# Patient Record
Sex: Male | Born: 1955 | Race: Black or African American | Hispanic: No | Marital: Married | Smoking: Former smoker
Health system: Southern US, Community
[De-identification: ages and names within clinical notes are randomized; demographics above are authoritative.]

## PROBLEM LIST (undated history)

## (undated) DIAGNOSIS — N289 Disorder of kidney and ureter, unspecified: Secondary | ICD-10-CM

## (undated) DIAGNOSIS — E785 Hyperlipidemia, unspecified: Secondary | ICD-10-CM

## (undated) DIAGNOSIS — I10 Essential (primary) hypertension: Secondary | ICD-10-CM

## (undated) DIAGNOSIS — C61 Malignant neoplasm of prostate: Secondary | ICD-10-CM

## (undated) DIAGNOSIS — M109 Gout, unspecified: Secondary | ICD-10-CM

## (undated) DIAGNOSIS — E119 Type 2 diabetes mellitus without complications: Secondary | ICD-10-CM

## (undated) DIAGNOSIS — M1990 Unspecified osteoarthritis, unspecified site: Secondary | ICD-10-CM

## (undated) DIAGNOSIS — K648 Other hemorrhoids: Secondary | ICD-10-CM

## (undated) HISTORY — DX: Disorder of kidney and ureter, unspecified: N28.9

## (undated) HISTORY — DX: Gout, unspecified: M10.9

## (undated) HISTORY — DX: Unspecified osteoarthritis, unspecified site: M19.90

## (undated) HISTORY — DX: Essential (primary) hypertension: I10

## (undated) HISTORY — DX: Other hemorrhoids: K64.8

## (undated) HISTORY — DX: Type 2 diabetes mellitus without complications: E11.9

## (undated) HISTORY — DX: Malignant neoplasm of prostate: C61

## (undated) HISTORY — PX: NEPHRECTOMY: SHX65

## (undated) HISTORY — DX: Hyperlipidemia, unspecified: E78.5

---

## 1998-10-08 ENCOUNTER — Ambulatory Visit (HOSPITAL_COMMUNITY): Admission: RE | Admit: 1998-10-08 | Discharge: 1998-10-08 | Payer: Self-pay | Admitting: Family Medicine

## 1998-12-02 ENCOUNTER — Ambulatory Visit (HOSPITAL_COMMUNITY): Admission: RE | Admit: 1998-12-02 | Discharge: 1998-12-02 | Payer: Self-pay | Admitting: Urology

## 1998-12-02 ENCOUNTER — Encounter: Payer: Self-pay | Admitting: Urology

## 1999-12-04 ENCOUNTER — Ambulatory Visit (HOSPITAL_COMMUNITY): Admission: RE | Admit: 1999-12-04 | Discharge: 1999-12-04 | Payer: Self-pay | Admitting: Urology

## 1999-12-04 ENCOUNTER — Encounter: Payer: Self-pay | Admitting: Urology

## 2001-06-21 ENCOUNTER — Ambulatory Visit (HOSPITAL_COMMUNITY): Admission: RE | Admit: 2001-06-21 | Discharge: 2001-06-21 | Payer: Self-pay | Admitting: Family Medicine

## 2001-06-21 ENCOUNTER — Encounter: Payer: Self-pay | Admitting: Family Medicine

## 2001-06-27 ENCOUNTER — Encounter: Payer: Self-pay | Admitting: Family Medicine

## 2001-06-27 ENCOUNTER — Encounter: Admission: RE | Admit: 2001-06-27 | Discharge: 2001-06-27 | Payer: Self-pay | Admitting: Family Medicine

## 2001-06-29 ENCOUNTER — Ambulatory Visit (HOSPITAL_COMMUNITY): Admission: RE | Admit: 2001-06-29 | Discharge: 2001-06-29 | Payer: Self-pay | Admitting: Family Medicine

## 2001-06-30 ENCOUNTER — Ambulatory Visit (HOSPITAL_COMMUNITY): Admission: RE | Admit: 2001-06-30 | Discharge: 2001-06-30 | Payer: Self-pay | Admitting: Family Medicine

## 2001-06-30 ENCOUNTER — Encounter: Payer: Self-pay | Admitting: Family Medicine

## 2001-07-06 ENCOUNTER — Encounter: Payer: Self-pay | Admitting: Family Medicine

## 2001-07-06 ENCOUNTER — Ambulatory Visit (HOSPITAL_COMMUNITY): Admission: RE | Admit: 2001-07-06 | Discharge: 2001-07-06 | Payer: Self-pay | Admitting: Family Medicine

## 2001-07-13 ENCOUNTER — Encounter: Admission: RE | Admit: 2001-07-13 | Discharge: 2001-07-13 | Payer: Self-pay | Admitting: Family Medicine

## 2001-07-13 ENCOUNTER — Encounter: Payer: Self-pay | Admitting: Family Medicine

## 2001-08-16 ENCOUNTER — Ambulatory Visit (HOSPITAL_COMMUNITY): Admission: RE | Admit: 2001-08-16 | Discharge: 2001-08-16 | Payer: Self-pay | Admitting: Urology

## 2001-08-16 ENCOUNTER — Encounter: Payer: Self-pay | Admitting: Urology

## 2002-02-12 ENCOUNTER — Emergency Department (HOSPITAL_COMMUNITY): Admission: EM | Admit: 2002-02-12 | Discharge: 2002-02-12 | Payer: Self-pay

## 2002-02-13 ENCOUNTER — Encounter: Payer: Self-pay | Admitting: Family Medicine

## 2002-02-13 ENCOUNTER — Ambulatory Visit (HOSPITAL_COMMUNITY): Admission: RE | Admit: 2002-02-13 | Discharge: 2002-02-13 | Payer: Self-pay | Admitting: Family Medicine

## 2002-03-09 ENCOUNTER — Ambulatory Visit (HOSPITAL_COMMUNITY): Admission: RE | Admit: 2002-03-09 | Discharge: 2002-03-09 | Payer: Self-pay | Admitting: Family Medicine

## 2002-11-24 ENCOUNTER — Ambulatory Visit (HOSPITAL_COMMUNITY): Admission: RE | Admit: 2002-11-24 | Discharge: 2002-11-24 | Payer: Self-pay | Admitting: Family Medicine

## 2003-05-21 ENCOUNTER — Encounter: Payer: Self-pay | Admitting: Family Medicine

## 2003-05-21 ENCOUNTER — Encounter: Admission: RE | Admit: 2003-05-21 | Discharge: 2003-05-21 | Payer: Self-pay | Admitting: Family Medicine

## 2003-11-09 ENCOUNTER — Encounter: Admission: RE | Admit: 2003-11-09 | Discharge: 2003-11-09 | Payer: Self-pay | Admitting: Family Medicine

## 2005-09-08 ENCOUNTER — Ambulatory Visit (HOSPITAL_COMMUNITY): Admission: RE | Admit: 2005-09-08 | Discharge: 2005-09-08 | Payer: Self-pay | Admitting: *Deleted

## 2006-06-24 ENCOUNTER — Ambulatory Visit (HOSPITAL_COMMUNITY): Admission: RE | Admit: 2006-06-24 | Discharge: 2006-06-25 | Payer: Self-pay | Admitting: Urology

## 2007-03-24 IMAGING — CR DG CHEST 2V
2 series · 2 of 2 positions shown · non-contrast
Comparison: Reports of a chest x-ray and chest CT from January 2002.

CLINICAL DATA: Prostate cancer.  Preop. 
CHEST - 2 VIEW:

[view not recorded (1 of 2)]
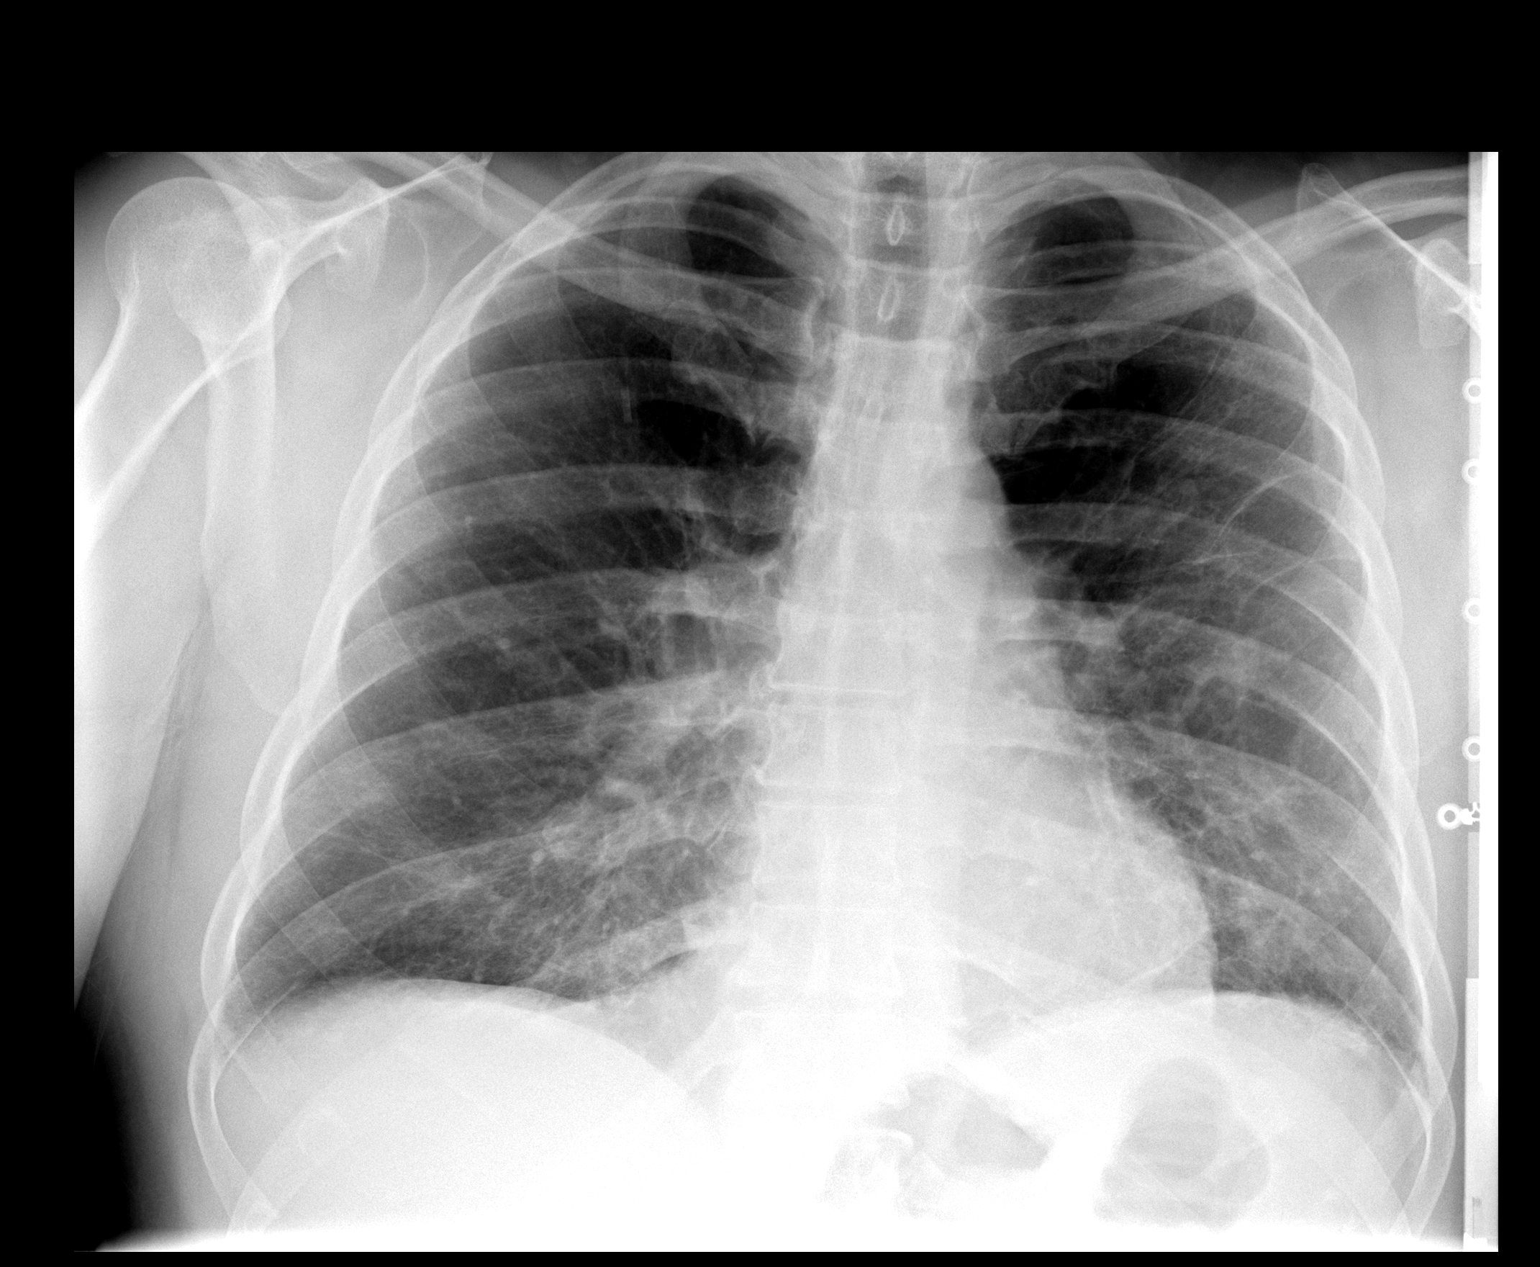

[view not recorded (2 of 2)]
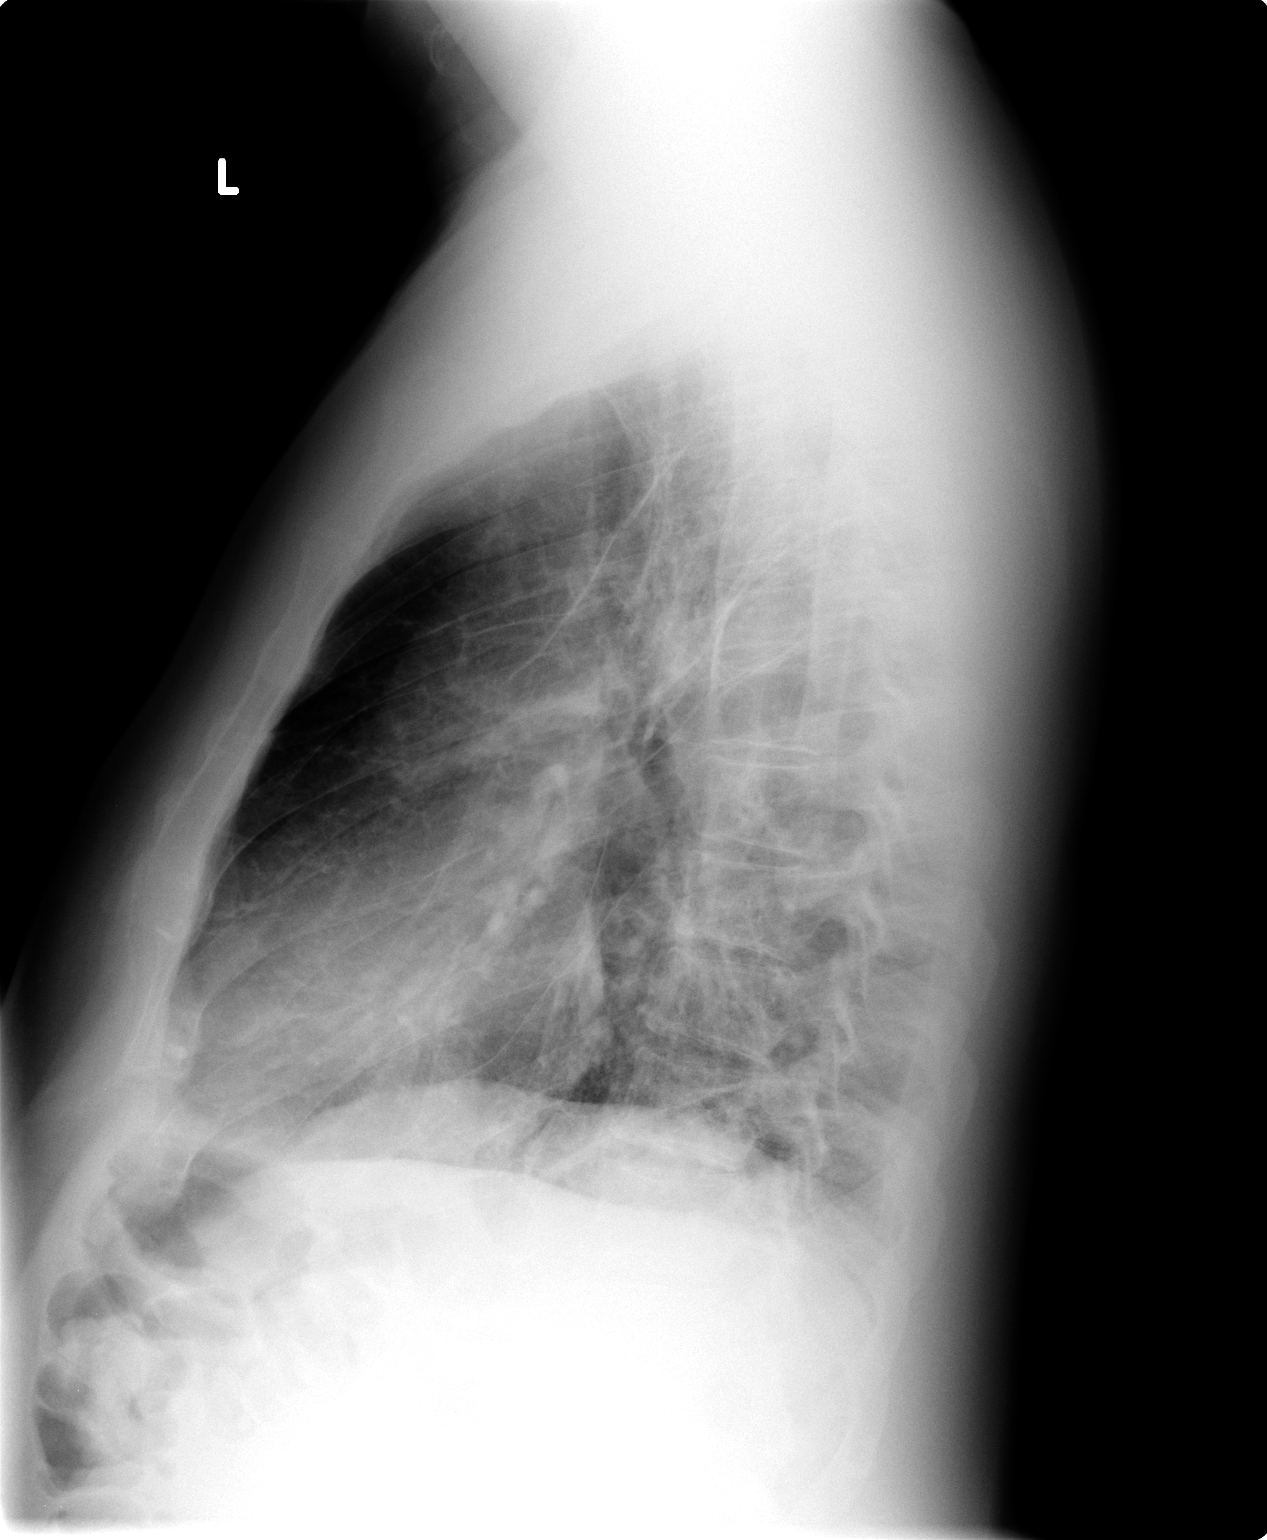

[2 of 2 positions shown; findings below may reference images not displayed]

FINDINGS: Heart is not enlarged.  There is pulmonary scarring especially on the left which was described on prior reports.  There is also scarring on the right.  There does not appear to be any acute infiltrate or effusion and there is no heart failure.
IMPRESSION: Chronic lung disease and bilateral scarring.  No acute abnormality.

## 2016-03-17 ENCOUNTER — Encounter: Payer: Self-pay | Admitting: Gastroenterology

## 2016-05-25 ENCOUNTER — Encounter (INDEPENDENT_AMBULATORY_CARE_PROVIDER_SITE_OTHER): Payer: Self-pay

## 2016-05-25 ENCOUNTER — Ambulatory Visit (INDEPENDENT_AMBULATORY_CARE_PROVIDER_SITE_OTHER): Payer: BLUE CROSS/BLUE SHIELD | Admitting: Gastroenterology

## 2016-05-25 ENCOUNTER — Encounter: Payer: Self-pay | Admitting: Gastroenterology

## 2016-05-25 VITALS — BP 130/72 | HR 76 | Ht 71.0 in | Wt 224.8 lb

## 2016-05-25 DIAGNOSIS — K625 Hemorrhage of anus and rectum: Secondary | ICD-10-CM

## 2016-05-25 DIAGNOSIS — Z1211 Encounter for screening for malignant neoplasm of colon: Secondary | ICD-10-CM | POA: Diagnosis not present

## 2016-05-25 MED ORDER — NA SULFATE-K SULFATE-MG SULF 17.5-3.13-1.6 GM/177ML PO SOLN: 1.0000 | Freq: Once | ORAL | 0 refills | Status: AC

## 2016-05-25 NOTE — Progress Notes (Signed)
HPI: This is a very pleasant 59 year old man   who was referred to me by Dr. Criss Rosales  Chief complaint is rectal bleeding 6 months ago  Saw rectal bleeding this past April, lasted 3-4 days.  No obvious hemorrhoids.  No straining, no diarrhea.  He has not noticed any bleeding since then  Had been taking Bayer ASA for about a week prior, stopped it.  Prostate cancer, 6 years ago, s/p surgery no XRT.  Colonoscopy in 2007; no issues, was for Routine screening. This was done by Dr. Jim Desanctis. I cannot find the actual report in the Epic electronic medical system.  No colon cancer in the family.  Intentionally losing weight, 16 pounds in about a year.  Review of systems: Pertinent positive and negative review of systems were noted in the above HPI section. Complete review of systems was performed and was otherwise normal.   Past Medical History:  Diagnosis Date  . Arthritis   . Diabetes ([place])   . Gout   . HTN (hypertension)   . Hyperlipidemia   . Internal hemorrhoids   . Prostate cancer ([place])   . Renal insufficiency     Past Surgical History:  Procedure Laterality Date  . NEPHRECTOMY      Current Outpatient Prescriptions  Medication Sig Dispense Refill  . Amlodipine-Valsartan-HCTZ 10-320-25 MG TABS Take 1 tablet by mouth daily.    Marland Kitchen BYSTOLIC 20 MG TABS Take 1 tablet by mouth daily.    Marland Kitchen doxazosin (CARDURA) 4 MG tablet Take 4 mg by mouth daily.    Marland Kitchen L-Methylfolate-Algae-B12-B6 (METANX) 3-90.314-2-35 MG CAPS Take 1 Can by mouth 2 (two) times daily.    Marland Kitchen ULORIC 40 MG tablet Take 1 tablet by mouth daily.     No current facility-administered medications for this visit.     Allergies as of 05/25/2016  . (No Known Allergies)    Family History  Problem Relation Age of Onset  . Colon cancer Father 35  . Diabetes Mother   . Kidney disease Mother     Social History   Social History  . Marital status: Married    Spouse name: N/A  . Number of children: 3  . Years of  education: N/A   Occupational History  . Financial    Social History Main Topics  . Smoking status: Former Research scientist (life sciences)  . Smokeless tobacco: Never Used  . Alcohol use No  . Drug use: No  . Sexual activity: Not on file   Other Topics Concern  . Not on file   Social History Narrative  . No narrative on file     Physical Exam: BP 130/72 (BP Location: Left Arm, Patient Position: Sitting, Cuff Size: Normal)   Pulse 76   Ht 5\' 11"  (1.803 m)   Wt 224 lb 12.8 oz (102 kg)   BMI 31.35 kg/m  Constitutional: generally well-appearing Psychiatric: alert and oriented x3 Eyes: extraocular movements intact Mouth: oral pharynx moist, no lesions Neck: supple no lymphadenopathy Cardiovascular: heart regular rate and rhythm Lungs: clear to auscultation bilaterally Abdomen: soft, nontender, nondistended, no obvious ascites, no peritoneal signs, normal bowel sounds Extremities: no lower extremity edema bilaterally Skin: no lesions on visible extremities Rectal exam deferred for upcoming colonoscopy  Assessment and plan: 60 y.o. male with  routine risk for colon cancer minor rectal bleeding 6 months ago.,   I think it is very unlikely that he has any serious, significant pathology. His minor rectal bleeding was probably hemorrhoidal related, benign anal rectal  disease. He has not noticed any bleeding again in 6 months. I recommended we proceed with colonoscopy for colon cancer screening at his soonest convenience. His last colonoscopy was little over 10 years ago. I see no reason for any further blood tests or imaging studies prior to then.    Owens Loffler, MD [facility] 05/25/2016, 1:33 PM  Cc: Dr. Criss Rosales

## 2016-05-25 NOTE — Patient Instructions (Signed)
You will be set up for a colonoscopy for routine screening.  

## 2016-06-10 ENCOUNTER — Encounter: Payer: Self-pay | Admitting: Gastroenterology

## 2016-06-22 ENCOUNTER — Telehealth: Payer: Self-pay | Admitting: Gastroenterology

## 2016-06-22 ENCOUNTER — Other Ambulatory Visit: Payer: Self-pay

## 2016-06-22 MED ORDER — NA SULFATE-K SULFATE-MG SULF 17.5-3.13-1.6 GM/177ML PO SOLN: 1.0000 | Freq: Once | ORAL | 0 refills | Status: AC

## 2016-06-23 ENCOUNTER — Encounter: Payer: Self-pay | Admitting: Gastroenterology

## 2016-06-23 ENCOUNTER — Ambulatory Visit (AMBULATORY_SURGERY_CENTER): Payer: BLUE CROSS/BLUE SHIELD | Admitting: Gastroenterology

## 2016-06-23 VITALS — BP 131/76 | HR 74 | Temp 98.0°F | Resp 17 | Ht 71.0 in | Wt 224.0 lb

## 2016-06-23 DIAGNOSIS — D120 Benign neoplasm of cecum: Secondary | ICD-10-CM

## 2016-06-23 DIAGNOSIS — Z1212 Encounter for screening for malignant neoplasm of rectum: Secondary | ICD-10-CM

## 2016-06-23 DIAGNOSIS — K625 Hemorrhage of anus and rectum: Secondary | ICD-10-CM

## 2016-06-23 DIAGNOSIS — D122 Benign neoplasm of ascending colon: Secondary | ICD-10-CM | POA: Diagnosis not present

## 2016-06-23 DIAGNOSIS — D124 Benign neoplasm of descending colon: Secondary | ICD-10-CM

## 2016-06-23 DIAGNOSIS — D123 Benign neoplasm of transverse colon: Secondary | ICD-10-CM

## 2016-06-23 DIAGNOSIS — Z1211 Encounter for screening for malignant neoplasm of colon: Secondary | ICD-10-CM

## 2016-06-23 MED ORDER — SODIUM CHLORIDE 0.9 % IV SOLN: 500.0000 mL | INTRAVENOUS | Status: AC

## 2016-06-23 NOTE — Addendum Note (Signed)
Addended by: Ernestine Conrad D on: 06/23/2016 02:23 PM   Modules accepted: Orders

## 2016-06-23 NOTE — Op Note (Signed)
[facility] Patient Name: Mathew Sandoval Procedure Date: 06/23/2016 2:02 PM MRN: [id number] Endoscopist: Milus Banister , MD Age: 60 Referring MD:  Date of Birth: 1956-05-01 Gender: Male Account #: [id number] Procedure:                Colonoscopy Indications:              Screening for colorectal malignant neoplasm; remote                            minor rectal bleeding. Medicines:                Monitored Anesthesia Care Procedure:                Pre-Anesthesia Assessment:                           - Prior to the procedure, a History and Physical                            was performed, and patient medications and                            allergies were reviewed. The patient's tolerance of                            previous anesthesia was also reviewed. The risks                            and benefits of the procedure and the sedation                            options and risks were discussed with the patient.                            All questions were answered, and informed consent                            was obtained. Prior Anticoagulants: The patient has                            taken no previous anticoagulant or antiplatelet                            agents. ASA Grade Assessment: II - A patient with                            mild systemic disease. After reviewing the risks                            and benefits, the patient was deemed in                            satisfactory condition to undergo the procedure.  After obtaining informed consent, the colonoscope                            was passed under direct vision. Throughout the                            procedure, the patient's blood pressure, pulse, and                            oxygen saturations were monitored continuously. The                            Model CF-HQ190L [id number]) scope was introduced                            through the anus and advanced to  the the cecum,                            identified by appendiceal orifice and ileocecal                            valve. The colonoscopy was performed without                            difficulty. The patient tolerated the procedure                            well. The quality of the bowel preparation was                            good. The ileocecal valve, appendiceal orifice, and                            rectum were photographed. Scope In: 2:07:12 PM Scope Out: 2:21:49 PM Scope Withdrawal Time: 0 hours 13 minutes 4 seconds  Total Procedure Duration: 0 hours 14 minutes 37 seconds  Findings:                 Seven sessile polyps were found in the descending                            colon, transverse colon, ascending colon and cecum.                            The polyps were 3 to 8 mm in size. These polyps                            were removed with a cold snare. Resection and                            retrieval were complete.                           A 17 mm polyp was found in the mid  transverse                            colon. The polyp was pedunculated. The polyp was                            removed with a hot snare. Resection and retrieval                            were complete.                           A few medium-mouthed diverticula were found in the                            left colon.                           The exam was otherwise without abnormality on                            direct and retroflexion views.                           Internal hemorrhoids were found. The hemorrhoids                            were small. Complications:            No immediate complications. Estimated blood loss:                            None. Estimated Blood Loss:     Estimated blood loss: none. Impression:               - Seven 3 to 8 mm polyps in the descending colon,                            in the transverse colon, in the ascending colon and                             in the cecum, removed with a cold snare. Resected                            and retrieved.                           - One 17 mm polyp in the mid transverse colon,                            removed with a hot snare. Resected and retrieved.                           - Diverticulosis in the left colon.                           -  Small internal hemorrhoids.                           - The examination was otherwise normal on direct                            and retroflexion views                           . Recommendation:           - Patient has a contact number available for                            emergencies. The signs and symptoms of potential                            delayed complications were discussed with the                            patient. Return to normal activities tomorrow.                            Written discharge instructions were provided to the                            patient.                           - Resume previous diet.                           - Continue present medications.                           You will receive a letter within 2-3 weeks with the                            pathology results and my final recommendations.                           If the polyp(s) is proven to be 'pre-cancerous' on                            pathology, you will need repeat colonoscopy in 3                            years. If the polyp(s) is NOT 'precancerous' on                            pathology then you should repeat colon cancer                            screening in 10 years with colonoscopy without need  for colon cancer screening by any method prior to                            then (including stool testing). Milus Banister, MD 06/23/2016 2:26:34 PM This report has been signed electronically.

## 2016-06-23 NOTE — Patient Instructions (Signed)
YOU HAD AN ENDOSCOPIC PROCEDURE TODAY AT THE [facility]:   Refer to the procedure report that was given to you for any specific questions about what was found during the examination.  If the procedure report does not answer your questions, please call your gastroenterologist to clarify.  If you requested that your care partner not be given the details of your procedure findings, then the procedure report has been included in a sealed envelope for you to review at your convenience later.  YOU SHOULD EXPECT: Some feelings of bloating in the abdomen. Passage of more gas than usual.  Walking can help get rid of the air that was put into your GI tract during the procedure and reduce the bloating. If you had a lower endoscopy (such as a colonoscopy or flexible sigmoidoscopy) you may notice spotting of blood in your stool or on the toilet paper. If you underwent a bowel prep for your procedure, you may not have a normal bowel movement for a few days.  Please Note:  You might notice some irritation and congestion in your nose or some drainage.  This is from the oxygen used during your procedure.  There is no need for concern and it should clear up in a day or so.  SYMPTOMS TO REPORT IMMEDIATELY:   Following lower endoscopy (colonoscopy or flexible sigmoidoscopy):  Excessive amounts of blood in the stool  Significant tenderness or worsening of abdominal pains  Swelling of the abdomen that is new, acute  Fever of 100F or higher   For urgent or emergent issues, a gastroenterologist can be reached at any hour by calling [phone].   DIET:  We do recommend a small meal at first, but then you may proceed to your regular diet.  Drink plenty of fluids but you should avoid alcoholic beverages for 24 hours.  ACTIVITY:  You should plan to take it easy for the rest of today and you should NOT DRIVE or use heavy machinery until tomorrow (because of the sedation medicines used during the test).     FOLLOW UP: Our staff will call the number listed on your records the next business day following your procedure to check on you and address any questions or concerns that you may have regarding the information given to you following your procedure. If we do not reach you, we will leave a message.  However, if you are feeling well and you are not experiencing any problems, there is no need to return our call.  We will assume that you have returned to your regular daily activities without incident.  If any biopsies were taken you will be contacted by phone or by letter within the next 1-3 weeks.  Please call us at [phone] if you have not heard about the biopsies in 3 weeks.    SIGNATURES/CONFIDENTIALITY: You and/or your care partner have signed paperwork which will be entered into your electronic medical record.  These signatures attest to the fact that that the information above on your After Visit Summary has been reviewed and is understood.  Full responsibility of the confidentiality of this discharge information lies with you and/or your care-partner.   Resume medications. Information given on polyps,diverticulosis and hemorrhoids. 

## 2016-06-23 NOTE — Progress Notes (Signed)
Report to PACU, RN, vss, BBS= Clear.  

## 2016-06-24 ENCOUNTER — Telehealth: Payer: Self-pay

## 2016-06-24 NOTE — Telephone Encounter (Signed)
  Follow up Call-  Call back number 06/23/2016  Post procedure Call Back phone  # [phone]  Permission to leave phone message Yes  Some recent data might be hidden     Patient questions:  Do you have a fever, pain , or abdominal swelling? No. Pain Score  0 *  Have you tolerated food without any problems? Yes.    Have you been able to return to your normal activities? Yes.    Do you have any questions about your discharge instructions: Diet   No. Medications  No. Follow up visit  No.  Do you have questions or concerns about your Care? No.  Actions: * If pain score is 4 or above: No action needed, pain <4.

## 2016-06-29 ENCOUNTER — Encounter: Payer: Self-pay | Admitting: Gastroenterology
